# Patient Record
Sex: Female | Born: 2000 | Race: White | Hispanic: No | Marital: Single | Smoking: Former smoker
Health system: Southern US, Community
[De-identification: ages and names within clinical notes are randomized; demographics above are authoritative.]

## PROBLEM LIST (undated history)

## (undated) DIAGNOSIS — R569 Unspecified convulsions: Secondary | ICD-10-CM

## (undated) DIAGNOSIS — R519 Headache, unspecified: Secondary | ICD-10-CM

## (undated) DIAGNOSIS — Q040 Congenital malformations of corpus callosum: Secondary | ICD-10-CM

## (undated) HISTORY — PX: NO PAST SURGERIES: SHX2092

## (undated) HISTORY — PX: HX NO SURGICAL PROCEDURES: 2100001501

---
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
## 2019-01-05 ENCOUNTER — Encounter (HOSPITAL_COMMUNITY): Payer: Self-pay

## 2021-07-30 ENCOUNTER — Other Ambulatory Visit: Payer: Self-pay

## 2021-07-30 ENCOUNTER — Emergency Department
Admission: EM | Admit: 2021-07-30 | Discharge: 2021-07-30 | Disposition: A | Discharge status: Home / Self Care | Attending: Physician Assistant | Admitting: Physician Assistant

## 2021-07-30 ENCOUNTER — Encounter (HOSPITAL_COMMUNITY): Payer: Self-pay | Admitting: NURSE PRACTITIONER

## 2021-07-30 DIAGNOSIS — T7840XA Allergy, unspecified, initial encounter: Secondary | ICD-10-CM | POA: Insufficient documentation

## 2021-07-30 LAB — HCG, SERUM QUALITATIVE, PREGNANCY: PREGNANCY, SERUM QUALITATIVE: NEGATIVE

## 2021-07-30 LAB — LAVENDER TOP TUBE

## 2021-07-30 LAB — GRAY TOP TUBE

## 2021-07-30 LAB — BLUE TOP TUBE

## 2021-07-30 LAB — LIGHT GREEN TOP TUBE

## 2021-07-30 MED ORDER — METHYLPREDNISOLONE SOD SUCC 125 MG SOLUTION FOR INJECTION WRAPPER
125.0000 mg | INTRAVENOUS | Status: AC
Start: 2021-07-30 — End: 2021-07-30
  Administered 2021-07-30: 125 mg via INTRAVENOUS
  Filled 2021-07-30: qty 2

## 2021-07-30 MED ORDER — DIPHENHYDRAMINE 50 MG/ML INJECTION SOLUTION
12.5000 mg | INTRAMUSCULAR | Status: AC
Start: 2021-07-30 — End: 2021-07-30
  Administered 2021-07-30: 12.5 mg via INTRAVENOUS
  Filled 2021-07-30: qty 1

## 2021-07-30 NOTE — Nurses Notes (Signed)
Discharge reviewed with patient. Printed AVS given at this time as well as verbal instructions. Questions if any answered sufficiently. Pt voiced understanding of the instructions. Educated pt that if any worsening or concerning symptoms present they are encouraged to come back to be seen. Pt to POV.
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
## 2021-07-30 NOTE — Discharge Instructions (Addendum)
Drink plenty of water.   Take Benadryl every 4 hours as needed for allergy.    Try to avoid contact with honey.

## 2021-07-30 NOTE — ED Triage Notes (Signed)
Allergic to honey, ate food with honey accidentally now complains of "pins and needles" in throat and mouth.

## 2021-08-10 ENCOUNTER — Emergency Department (HOSPITAL_COMMUNITY): Payer: Medicaid - Out of State

## 2021-08-10 ENCOUNTER — Encounter (HOSPITAL_COMMUNITY): Payer: Self-pay

## 2021-08-10 ENCOUNTER — Other Ambulatory Visit: Payer: Self-pay

## 2021-08-10 ENCOUNTER — Emergency Department
Admission: EM | Admit: 2021-08-10 | Discharge: 2021-08-10 | Disposition: A | Discharge status: Home / Self Care | Payer: Medicaid - Out of State | Attending: PHYSICIAN ASSISTANT | Admitting: PHYSICIAN ASSISTANT

## 2021-08-10 DIAGNOSIS — S20212A Contusion of left front wall of thorax, initial encounter: Secondary | ICD-10-CM | POA: Insufficient documentation

## 2021-08-10 DIAGNOSIS — Z8669 Personal history of other diseases of the nervous system and sense organs: Secondary | ICD-10-CM | POA: Insufficient documentation

## 2021-08-10 DIAGNOSIS — F17290 Nicotine dependence, other tobacco product, uncomplicated: Secondary | ICD-10-CM | POA: Insufficient documentation

## 2021-08-10 DIAGNOSIS — N3000 Acute cystitis without hematuria: Secondary | ICD-10-CM | POA: Insufficient documentation

## 2021-08-10 DIAGNOSIS — R519 Headache, unspecified: Secondary | ICD-10-CM | POA: Insufficient documentation

## 2021-08-10 DIAGNOSIS — W500XXA Accidental hit or strike by another person, initial encounter: Secondary | ICD-10-CM | POA: Insufficient documentation

## 2021-08-10 DIAGNOSIS — Z20822 Contact with and (suspected) exposure to covid-19: Secondary | ICD-10-CM | POA: Insufficient documentation

## 2021-08-10 DIAGNOSIS — J209 Acute bronchitis, unspecified: Secondary | ICD-10-CM | POA: Insufficient documentation

## 2021-08-10 HISTORY — DX: Unspecified convulsions (CMS HCC): R56.9

## 2021-08-10 HISTORY — DX: Headache, unspecified: R51.9

## 2021-08-10 LAB — CBC WITH DIFF
BASOPHIL #: <0.1 10*3/uL (ref <=0.20)
BASOPHIL %: 1 %
EOSINOPHIL #: 0.2 10*3/uL (ref <=0.50)
EOSINOPHIL %: 2 %
HCT: 38.4 % (ref 34.8–46.0)
HGB: 13.7 g/dL (ref 11.5–16.0)
IMMATURE GRANULOCYTE #: <0.1 10*3/uL (ref <0.10)
IMMATURE GRANULOCYTE %: 0 % (ref 0–1)
LYMPHOCYTE #: 3.13 10*3/uL (ref 1.00–4.80)
LYMPHOCYTE %: 36 %
MCH: 31.8 pg (ref 26.0–32.0)
MCHC: 35.7 g/dL — ABNORMAL HIGH (ref 31.0–35.5)
MCV: 89.1 fL (ref 78.0–100.0)
MONOCYTE #: 0.48 10*3/uL (ref 0.20–1.10)
MONOCYTE %: 6 %
MPV: 9.7 fL (ref 8.7–12.5)
NEUTROPHIL #: 4.76 10*3/uL (ref 1.50–7.70)
NEUTROPHIL %: 55 %
PLATELETS: 330 10*3/uL (ref 150–400)
RBC: 4.31 10*6/uL (ref 3.85–5.22)
RDW-CV: 11.4 % — ABNORMAL LOW (ref 11.5–15.5)
WBC: 8.6 10*3/uL (ref 3.7–11.0)

## 2021-08-10 LAB — GRAY TOP TUBE

## 2021-08-10 LAB — COMPREHENSIVE METABOLIC PANEL, NON-FASTING
ALBUMIN: 4.4 g/dL (ref 3.5–5.0)
ALKALINE PHOSPHATASE: 74 U/L (ref 40–110)
ALT (SGPT): 23 U/L — ABNORMAL HIGH (ref 8–22)
ANION GAP: 11 mmol/L (ref 4–13)
AST (SGOT): 19 U/L (ref 8–45)
BILIRUBIN TOTAL: 0.5 mg/dL (ref 0.3–1.3)
BUN/CREA RATIO: 12 (ref 6–22)
BUN: 8 mg/dL (ref 8–25)
CALCIUM: 9.8 mg/dL (ref 8.5–10.0)
CHLORIDE: 107 mmol/L (ref 96–111)
CO2 TOTAL: 23 mmol/L (ref 22–30)
CREATININE: 0.65 mg/dL (ref 0.60–1.05)
ESTIMATED GFR: >90 mL/min/BSA (ref >=60)
GLUCOSE: 99 mg/dL (ref 65–125)
POTASSIUM: 3.9 mmol/L (ref 3.5–5.1)
PROTEIN TOTAL: 7.6 g/dL (ref 6.4–8.3)
SODIUM: 141 mmol/L (ref 136–145)

## 2021-08-10 LAB — URINALYSIS, MACROSCOPIC
BILIRUBIN: NEGATIVE mg/dL
BLOOD: NEGATIVE mg/dL
GLUCOSE: NEGATIVE mg/dL
KETONES: NEGATIVE mg/dL
NITRITE: NEGATIVE
PH: 6.5 (ref 5.0–8.5)
PROTEIN: NEGATIVE mg/dL
SPECIFIC GRAVITY: 1.015 (ref 1.005–1.030)
UROBILINOGEN: 0.2 mg/dL

## 2021-08-10 LAB — URINALYSIS, MICROSCOPIC

## 2021-08-10 LAB — COVID-19 ~~LOC~~ MOLECULAR LAB TESTING: SARS-CoV-2: NOT DETECTED

## 2021-08-10 LAB — BLUE TOP TUBE

## 2021-08-10 LAB — C-REACTIVE PROTEIN(CRP),INFLAMMATION: CRP INFLAMMATION: <0.4 mg/L (ref <8.0)

## 2021-08-10 LAB — HCG, SERUM QUALITATIVE, PREGNANCY: PREGNANCY, SERUM QUALITATIVE: NEGATIVE

## 2021-08-10 MED ORDER — CEFDINIR 300 MG CAPSULE
300.0000 mg | ORAL_CAPSULE | Freq: Two times a day (BID) | ORAL | 0 refills | Status: AC
Start: 2021-08-10 — End: 2021-08-20

## 2021-08-10 MED ORDER — CEFDINIR 300 MG CAPSULE
300.0000 mg | ORAL_CAPSULE | Freq: Two times a day (BID) | ORAL | Status: DC
Start: 2021-08-10 — End: 2021-08-10

## 2021-08-10 NOTE — Discharge Instructions (Addendum)
You were seen today for a urinary tract ifection and bronchitits. I am placing you on antibiotics.  Please follow-up with a family doctor for further evaluation of your headaches.  This does warrant a further workup in which cannot be completed to the ER.  However there were no acute findings on your x-rays or head CT today.  Please return to the ER for any fever, worsening symptoms, or new symptoms of concern.

## 2021-08-10 NOTE — ED Provider Notes (Signed)
[facility]  Emergency Department  Provider Note      Glenda Paul  07/05/2001  20 y.o.  female  [place] [address]   [phone] (home)  No Pcp    Chief Complaint:   Chief Complaint   Patient presents with   . Cough   . Vomiting       Arrival: POV    HPI: This is a 20 y.o. female with PMH of headaches and seizures who presents to the emergency department with complaints of coughing until she vomits x2 days.  She has had a headache.  Patient states she has been having migraines but these are the worst migraine she has ever had.   No fever.  Patient states that she was wrestling with her boyfriend when he fell on her and she is now complaining of left rib pain.  This happened today.    Patient is complaining of low back pain over the past 2 weeks.  She denies any injury.  She is concerned that she has a urinary tract infection because her mother told her it runs in her family.  She denies dysuria and hematuria.    Patient has had no known COVID exposures.    Past Medical History:   Past Medical History:   Diagnosis Date   . Convulsions (CMS HCC)    . Headache        Past Surgical History:   Past Surgical History:   Procedure Laterality Date   . Hx no surgical procedures         Social History:   Social History     Tobacco Use   . Smoking status: Former     Types: Cigarettes   . Smokeless tobacco: Never   Vaping Use   . Vaping Use: Every day   Substance Use Topics   . Alcohol use: Yes   . Drug use: Not Currently      Social History     Substance and Sexual Activity   Drug Use Not Currently       Allergies:   Allergies   Allergen Reactions   . Honey    . Pineapple        Medications:   Prior to Admission medications    Not on File       Above history, allergies and medication list reviewed with patient.      Review of Systems:  Review of systems as below.  Additional systems reviewed in HPI.   Consitutional:  Negative for fever, chills and fatigue/malaise.  Skin:  Negative for rashes or  other concerning lesions.  HENT:  Positive for headaches.  Negative for  sinus drainage, congestion and sorethoat.  Eyes:  Negative for blurry vision, double vision or acute vision loss.  Cardiovascular:  Negative for chest pain, orthopnea, DOE and palpitations. Negative for peripheral edema.  Respiratory:  Positive for cough, shortness of breath and negative for wheezing.  Gastrointestinal:  No abominal pain. Negative for nausea and vomiting. Negative for diarrhea, constipation, melena or hematochezia.  Musculoskeletal:  Positive for back pain.  Negative for neck pain.   Neurological:  Negative for dizziness, loss of consciousness, numbness and tingling. No acute localized weakness. No speech changes.  Psych:  Negative for anxiety, depression and hallucinations.  No suicidal ideations or attempts.  No homicidal ideations or attempts.  Genitourinary:  Negative for dysuria, hematuria and flank pain. No discharge .  LMP recorded (lmp unknown).  All other systems reviewed and negative  unless documented otherwise.      Physical Exam  RN note reviewed. Vitals reviewed.   Body mass index is 30.11 kg/m.  ED Triage Vitals [08/10/21 1755]   BP (Non-Invasive) 116/72   Heart Rate 88   Respiratory Rate 14   Temperature 36.7 C (98 F)   SpO2 99 %   Weight 77.1 kg (170 lb)   Height 1.6 m (5\' 3" )     Constitutional:  Pleasant female sitting up on the exam table of ED 3.  patient is alert. Well-developed, well-nourished, and in no acute distress. Nontoxic on appearance.   HENT:   Mask in place.  Head: Normocephalic and atraumatic.  No temporal artery tenderness.  Right Ear: External ear normal.   Left Ear: External ear normal.   Nose: Nose normal.   Mouth/Throat: Mucosal membranes are moist. Oropharynx is clear. Uvula is midline. Airway is patent.  Eyes: Pupils are equal, round, and reactive to light. Conjunctivae clear and EOM are normal.   Neck: Normal range of motion. Neck supple.  Trachea midline.  Meningeal signs are  negative.  Cardiovascular: Normal rate, regular rhythm, normal heart sounds and intact distal pulses.   Pulmonary/Chest:  Breath sounds normal.  Effort normal. No chest wall deformity.  Left anterior rib tenderness on palpation.  Abdominal: Non-tender. Soft. No ascites. Bowel sounds are normal.  No rebound tenderness or guarding.  No cerebrovascular accident tenderness.  Musculoskeletal: Normal range of motion for patient's baseline. No acute deformities.  Neurological: Patient is alert and oriented to person, place, and time. CN II-XII are intact. GCS score is 15. Sensory is at patient's baseline. Gait is at patient's baseline.  Skin: Skin is warm and dry. No rashes  Psychiatric: Mood, affect and judgment normal.  Memory is at patient's baseline.      ED Course/ MDM/ Plan:   Patient was triaged, vital signs were obtained, patient was  placed in a room.  On exam, patient is alert and oriented, nontoxic on exam, and in no acute distress. Vitals were reviewed. The following orders were placed after examining the patient and the following results were reviewed by me.    Orders Placed This Encounter   . URINE CULTURE,ROUTINE   . XR RIBS LEFT W PA/AP CHEST   . CT BRAIN WO IV CONTRAST   . CBC/DIFF   . COMPREHENSIVE METABOLIC PANEL, NON-FASTING   . C-REACTIVE PROTEIN(CRP),INFLAMMATION   . URINALYSIS, MACROSCOPIC AND MICROSCOPIC   . HCG, SERUM QUALITATIVE, PREGNANCY   . COVID-19 Screening - COVID Only   . CBC WITH DIFF   . URINALYSIS, MACROSCOPIC   . URINALYSIS, MICROSCOPIC   . EXTRA TUBES   . BLUE TOP TUBE   . GRAY TOP TUBE   . cefdinir (OMNICEF) capsule   . cefdinir (OMNICEF) 300 mg Oral Capsule     XR RIBS LEFT W PA/AP CHEST   Final Result   No consolidation to suggest pneumonia. No rib fracture identified.                        Radiologist location ID:         CT BRAIN WO IV CONTRAST   Final Result   No acute intracranial abnormality.         Radiologist location ID: [id number]           Labs Reviewed    COMPREHENSIVE METABOLIC PANEL, NON-FASTING - Abnormal; Notable for the following components:  Result Value    ALT (SGPT) 23 (*)     All other components within normal limits    Narrative:     Hemolysis can alter results at this level (slight).  Hemolysis can alter results at this level (slight).   CBC WITH DIFF - Abnormal; Notable for the following components:    MCHC 35.7 (*)     RDW-CV 11.4 (*)     All other components within normal limits   URINALYSIS, MACROSCOPIC - Abnormal; Notable for the following components:    LEUKOCYTES Small (*)     All other components within normal limits   URINALYSIS, MICROSCOPIC - Abnormal; Notable for the following components:    WBCS 6-10 (*)     BACTERIA Occasional (*)     CLUE CELLS Present (*)     MUCOUS Moderate (*)     All other components within normal limits   C-REACTIVE PROTEIN(CRP),INFLAMMATION - Normal   HCG, SERUM QUALITATIVE, PREGNANCY - Normal   COVID-19 [facility] TESTING - Normal    Narrative:     Results are for the qualitative identification of SARS-CoV-2 (formerly 2019-nCoV) RNA. The SARS-CoV-2 RNA is generally detectable in nasopharyngeal swabs and nasal wash/aspirates during the ACUTE PHASE of infection. Hence, this test is intended to be performed on respiratory specimens collected from individuals who meet Centers for Disease Control and Prevention (CDC) clinical and/or epidemiological criteria for Coronavirus Disease 2019 (COVID-19) testing. CDC COVID-19 criteria for testing on human specimens are available at [facility] webpage information for Healthcare Professionals: Coronavirus Disease 2019 (COVID-19) [email or web address]).    Disclaimer:  This assay has been authorized by FDA under an Emergency Use Authorization for use in laboratories certified under the Clinical Laboratory Improvement Amendments of 1988 (CLIA), 42 U.S.C. [phone], to perform high complexity tests. The impacts of vaccines, antiviral  therapeutics, antibiotics, chemotherapeutic or immunosuppressant drugs have not been evaluated.    Test methodology:   Cepheid Xpert Xpress SARS-CoV-2 Assay real-time polymerase chain reaction (RT-PCR) test on the GeneXpert Dx system.   URINE CULTURE,ROUTINE   CBC/DIFF    Narrative:     The following orders were created for panel order CBC/DIFF.  Procedure                               Abnormality         Status                     ---------                               -----------         ------                     CBC WITH MVEH[[id number]]                Abnormal            Final result                 Please view results for these tests on the individual orders.   URINALYSIS, MACROSCOPIC AND MICROSCOPIC    Narrative:     The following orders were created for panel order URINALYSIS, MACROSCOPIC AND MICROSCOPIC.  Procedure  Abnormality         Status                     ---------                               -----------         ------                     URINALYSIS, MACROSCOPIC[[id number]]      Abnormal            Final result               URINALYSIS, MICROSCOPIC[[id number]]      Abnormal            Final result                 Please view results for these tests on the individual orders.   EXTRA TUBES    Narrative:     The following orders were created for panel order EXTRA TUBES.  Procedure                               Abnormality         Status                     ---------                               -----------         ------                     Trixie Rude DXIP[[id number]]                                    In process                 GRAY TOP QBHA[[id number]]                                    In process                   Please view results for these tests on the individual orders.   BLUE TOP TUBE   GRAY TOP TUBE     Results for orders placed or performed during the hospital encounter of 08/10/21 (from the past 12 hour(s))   COMPREHENSIVE METABOLIC PANEL, NON-FASTING   Result Value  Ref Range    SODIUM 141 136 - 145 mmol/L    POTASSIUM 3.9 3.5 - 5.1 mmol/L    CHLORIDE 107 96 - 111 mmol/L    CO2 TOTAL 23 22 - 30 mmol/L    ANION GAP 11 4 - 13 mmol/L    BUN 8 8 - 25 mg/dL    CREATININE 9.73 5.32 - 1.05 mg/dL    BUN/CREA RATIO 12 6 - 22    ESTIMATED GFR >90 >=60 mL/min/BSA    ALBUMIN 4.4 3.5 - 5.0 g/dL     CALCIUM 9.8 8.5 - 99.2 mg/dL    GLUCOSE 99 65 - 426 mg/dL    ALKALINE  PHOSPHATASE 74 40 - 110 U/L    ALT (SGPT) 23 (H) 8 - 22 U/L    AST (SGOT)  19 8 - 45 U/L    BILIRUBIN TOTAL 0.5 0.3 - 1.3 mg/dL    PROTEIN TOTAL 7.6 6.4 - 8.3 g/dL   C-REACTIVE PROTEIN(CRP),INFLAMMATION   Result Value Ref Range    CRP INFLAMMATION <0.4 <8.0 mg/L   HCG, SERUM QUALITATIVE, PREGNANCY   Result Value Ref Range    PREGNANCY, SERUM QUALITATIVE Negative Negative   CBC WITH DIFF   Result Value Ref Range    WBC 8.6 3.7 - 11.0 x10^3/uL    RBC 4.31 3.85 - 5.22 x10^6/uL    HGB 13.7 11.5 - 16.0 g/dL    HCT 15.1 83.4 - 37.3 %    MCV 89.1 78.0 - 100.0 fL    MCH 31.8 26.0 - 32.0 pg    MCHC 35.7 (H) 31.0 - 35.5 g/dL    RDW-CV 57.8 (L) 97.8 - 15.5 %    PLATELETS 330 150 - 400 x10^3/uL    MPV 9.7 8.7 - 12.5 fL    NEUTROPHIL % 55 %    LYMPHOCYTE % 36 %    MONOCYTE % 6 %    EOSINOPHIL % 2 %    BASOPHIL % 1 %    NEUTROPHIL # 4.76 1.50 - 7.70 x10^3/uL    LYMPHOCYTE # 3.13 1.00 - 4.80 x10^3/uL    MONOCYTE # 0.48 0.20 - 1.10 x10^3/uL    EOSINOPHIL # 0.20 <=0.50 x10^3/uL    BASOPHIL # <0.10 <=0.20 x10^3/uL    IMMATURE GRANULOCYTE % 0 0 - 1 %    IMMATURE GRANULOCYTE # <0.10 <0.10 x10^3/uL   COVID-19 Screening - COVID Only   Result Value Ref Range    SARS-CoV-2 Not Detected Not Detected   URINALYSIS, MACROSCOPIC   Result Value Ref Range    COLOR Yellow Yellow, Straw    APPEARANCE Clear Clear, Slightly Hazy    SPECIFIC GRAVITY 1.015 1.005 - 1.030    PH 6.5 5.0 - 8.5    LEUKOCYTES Small (A) Negative WBCs/uL    NITRITE Negative Negative    PROTEIN Negative Negative mg/dL    GLUCOSE Negative Negative mg/dL    KETONES Negative Negative mg/dL     UROBILINOGEN 0.2 0.2 mg/dL    BILIRUBIN Negative Negative mg/dL    BLOOD Negative Negative mg/dL   URINALYSIS, MICROSCOPIC   Result Value Ref Range    RBCS 0-2 None, Occasional, 0-2, 3-5 /hpf    WBCS 6-10 (A) None, Occasional, 0-2, 3-5 /hpf    BACTERIA Occasional (A) None /hpf    SQUAMOUS EPITHELIAL 3-5 None, Occasional, 0-2, 3-5 /hpf    CLUE CELLS Present (A) Absent    MUCOUS Moderate (A) None /hpf     XR RIBS LEFT W PA/AP CHEST   Final Result by Edi, Radresults In (10/16 2010)   No consolidation to suggest pneumonia. No rib fracture identified.                        Radiologist location ID: [id number]         CT BRAIN WO IV CONTRAST   Final Result by Edi, Radresults In (10/16 2003)   No acute intracranial abnormality.         Radiologist location ID: [id number]               The patient received the following medications while in the ED (also  noted above):  Medications Administered in the ED   cefdinir (OMNICEF) capsule (has no administration in time range)       ED Course as of 08/10/21 2051   [facility] Aug 10, 2021   1907 CBC/DIFF(!)  Unremarkable   1941 C-REACTIVE PROTEIN HIGH SENSITIVITY (INFLAMMATION): <0.4  Undetectable   1941 PREGNANCY, SERUM QUALITATIVE: Negative  Negative   1941 COMPREHENSIVE METABOLIC PANEL, NON-FASTING(!)  ALT elevated at 23.   2035 SARS CORONAVIRUS 2 (SARS-CoV-2): Not Detected  Negative    2037 URINALYSIS, MACROSCOPIC AND MICROSCOPIC(!)  Small leukocyte estrace    2045 URINALYSIS, MACROSCOPIC AND MICROSCOPIC(!)  Urine culture obtained         Procedures  None    Critical Care:    Not applicable.    Work-up completed.  No rib fractures.  No pneumonia.  Patient's vitals were stable without hypoxia.  Urine did reveal a urinary tract infection.  Will place patient on cefdinir for urinary tract infection.  Urine culture was obtained.  Advised patient to establish care with a PCP for further workup of her headaches.  Hemodynamically stable neurovascularly intact for discharge home.  Patient  agrees with treatment plan.    Clinical Impression:   Encounter Diagnoses   Name Primary?   . Acute cystitis without hematuria Yes   . Acute bronchitis, unspecified organism    . Contusion of rib on left side, initial encounter    . Nonintractable headache, unspecified chronicity pattern, unspecified headache type        Following the above history, physical exam and studies, the patient was deemed stable and suitable for discharge.  Discharge and medication instructions were discussed with the patient and all questions were addressed. The patient understands that they may return to the ED at any time for new or worsening symptoms including Hypoxia, hematuria, fever, or if they have any other concerns    Disposition: Discharged  New Prescriptions    CEFDINIR (OMNICEF) 300 MG ORAL CAPSULE    Take 1 Capsule (300 mg total) by mouth Twice daily for 10 days      Primary Care Provider  Please establish care with a PCP for further follow-up.  In 3 days      [facility] [place]  [address]  [place] [address]  [phone]    As needed, If symptoms worsen     No future appointments.  BP 116/72   Pulse 88   Temp 36.7 C (98 F)   Resp 14   Ht 1.6 m (5\' 3" )   Wt 77.1 kg (170 lb)   LMP  (LMP Unknown)   SpO2 99%   BMI 30.11 kg/m          , PA-C10/16/2022       This note was partially created using voice recognition software and is inherently subject to errors including those of syntax and "sound alike " substitutions which may escape proof reading.  In such instances, original meaning may be extrapolated by contextual derivation.

## 2021-08-10 NOTE — ED Triage Notes (Signed)
Cough, vomiting, back pain, headache x 2 days  "My migraines have been getting worse x 2 weeks."

## 2021-08-10 NOTE — ED Nurses Note (Signed)
Pt in room, A&O, stable. Informed on needed urine to collect. Placed in gown for radiology, denies any needs, call bell in reach

## 2021-08-10 NOTE — ED Nurses Note (Signed)
Patient discharged with printed AVS and all personal belongings. Pt accompanied by female guest who leaves ED with patient.  Pt ambulates without assistance, gait steady and even, VSS; MAE: AOx4

## 2021-08-12 LAB — URINE CULTURE,ROUTINE: URINE CULTURE: >100000 — AB

## 2022-12-28 DIAGNOSIS — Z87891 Personal history of nicotine dependence: Secondary | ICD-10-CM | POA: Insufficient documentation

## 2022-12-28 DIAGNOSIS — Z3200 Encounter for pregnancy test, result unknown: Secondary | ICD-10-CM | POA: Insufficient documentation

## 2022-12-28 DIAGNOSIS — N390 Urinary tract infection, site not specified: Secondary | ICD-10-CM | POA: Insufficient documentation

## 2022-12-28 DIAGNOSIS — Z87440 Personal history of urinary (tract) infections: Secondary | ICD-10-CM | POA: Insufficient documentation

## 2022-12-28 DIAGNOSIS — Z3A01 Less than 8 weeks gestation of pregnancy: Secondary | ICD-10-CM

## 2022-12-28 DIAGNOSIS — O26891 Other specified pregnancy related conditions, first trimester: Secondary | ICD-10-CM | POA: Insufficient documentation

## 2022-12-28 DIAGNOSIS — O200 Threatened abortion: Secondary | ICD-10-CM | POA: Insufficient documentation

## 2022-12-28 LAB — BASIC METABOLIC PANEL
ANION GAP: 5 mmol/L (ref 4–13)
BUN/CREA RATIO: 13 (ref 6–22)
BUN: 9 mg/dL (ref 8–25)
CALCIUM: 8.8 mg/dL (ref 8.6–10.2)
CHLORIDE: 110 mmol/L (ref 96–111)
CO2 TOTAL: 23 mmol/L (ref 22–30)
CREATININE: 0.7 mg/dL (ref 0.60–1.05)
ESTIMATED GFR - FEMALE: >90 mL/min/BSA (ref >=60)
GLUCOSE: 91 mg/dL (ref 65–125)
POTASSIUM: 3.5 mmol/L (ref 3.5–5.1)
SODIUM: 138 mmol/L (ref 136–145)

## 2022-12-28 LAB — URINALYSIS, MACRO/MICRO
BILIRUBIN: NEGATIVE mg/dL
BLOOD: 0.2 mg/dL — AB
COLOR: NORMAL
GLUCOSE: NEGATIVE mg/dL
KETONES: NEGATIVE mg/dL
LEUKOCYTES: 75 WBCs/uL
NITRITE: NEGATIVE
PH: 6.5 (ref 5.0–8.0)
PROTEIN: 10 mg/dL
SPECIFIC GRAVITY: >1.03 — ABNORMAL HIGH (ref 1.005–1.030)
UROBILINOGEN: NEGATIVE mg/dL

## 2022-12-28 LAB — CBC WITH DIFF
BASOPHIL #: <0.1 10*3/uL (ref <=0.20)
BASOPHIL %: 1 %
EOSINOPHIL #: 0.21 10*3/uL (ref <=0.50)
EOSINOPHIL %: 3 %
HCT: 38 % (ref 34.8–46.0)
HGB: 12.8 g/dL (ref 11.5–16.0)
IMMATURE GRANULOCYTE #: <0.1 10*3/uL (ref <0.10)
IMMATURE GRANULOCYTE %: 0 % (ref 0.0–1.0)
LYMPHOCYTE #: 2.89 10*3/uL (ref 1.00–4.80)
LYMPHOCYTE %: 44 %
MCH: 31.4 pg (ref 26.0–32.0)
MCHC: 33.7 g/dL (ref 31.0–35.5)
MCV: 93.1 fL (ref 78.0–100.0)
MONOCYTE #: 0.35 10*3/uL (ref 0.20–1.10)
MONOCYTE %: 5 %
MPV: 9.3 fL (ref 8.7–12.5)
NEUTROPHIL #: 2.98 10*3/uL (ref 1.50–7.70)
NEUTROPHIL %: 47 %
PLATELETS: 341 10*3/uL (ref 150–400)
RBC: 4.08 10*6/uL (ref 3.85–5.22)
RDW-CV: 11.3 % — ABNORMAL LOW (ref 11.5–15.5)
WBC: 6.5 10*3/uL (ref 3.7–11.0)

## 2022-12-28 LAB — TYPE AND SCREEN
ABO/RH(D): O POS
ANTIBODY SCREEN: NEGATIVE

## 2022-12-28 LAB — HCG, PLASMA OR SERUM QUANTITATIVE, PREGNANCY: HCG QUANTITATIVE PREGNANCY: 24 m[IU]/mL — ABNORMAL HIGH (ref <5)

## 2022-12-28 NOTE — ED Nurses Note (Signed)
The risk and benefits have been discussed with the patient in regards to placing a peripheral intravenous catheter (PIV) and/or drawing labs in the triage area. The patient was advised that they may have to wait in the ED waiting room after the PIV is placed. The patient was advised not to tamper with the PIV or infuse anything through the PIV. The patient was advised if there is any concern or problem with the PIV to contact a nurse or a staff representative to contact a nurse for the patient. The patient was advised if they choose to leave before being seen by a provider or without treatment; the patient needs to notify the a nurse or a staff representative to contact a nurse, so the PIV can be removed. The patient is aware not to leave the ED waiting room area while the PIV is in place. The patient verbalizes understanding and agrees with the plan of action.

## 2022-12-28 NOTE — ED Triage Notes (Signed)
Pt states that she was told she was 7-10 days pregnant at another facility. Pt states that she is now having cramping and bleeding.

## 2022-12-29 ENCOUNTER — Emergency Department
Admission: EM | Admit: 2022-12-29 | Discharge: 2022-12-29 | Disposition: A | Discharge status: Home / Self Care | Payer: Medicaid - Out of State | Attending: PHYSICIAN ASSISTANT | Admitting: PHYSICIAN ASSISTANT

## 2022-12-29 ENCOUNTER — Other Ambulatory Visit: Payer: Self-pay

## 2022-12-29 ENCOUNTER — Emergency Department (HOSPITAL_COMMUNITY): Payer: Medicaid - Out of State | Admitting: Radiology

## 2022-12-29 ENCOUNTER — Emergency Department (HOSPITAL_COMMUNITY): Payer: BC Managed Care – PPO

## 2022-12-29 DIAGNOSIS — O26891 Other specified pregnancy related conditions, first trimester: Secondary | ICD-10-CM | POA: Insufficient documentation

## 2022-12-29 DIAGNOSIS — Z87891 Personal history of nicotine dependence: Secondary | ICD-10-CM | POA: Insufficient documentation

## 2022-12-29 DIAGNOSIS — Z87440 Personal history of urinary (tract) infections: Secondary | ICD-10-CM | POA: Insufficient documentation

## 2022-12-29 DIAGNOSIS — O200 Threatened abortion: Secondary | ICD-10-CM | POA: Insufficient documentation

## 2022-12-29 DIAGNOSIS — N390 Urinary tract infection, site not specified: Secondary | ICD-10-CM | POA: Insufficient documentation

## 2022-12-29 DIAGNOSIS — Z3200 Encounter for pregnancy test, result unknown: Secondary | ICD-10-CM | POA: Insufficient documentation

## 2022-12-29 MED ORDER — CEPHALEXIN 500 MG CAPSULE
500.0000 mg | ORAL_CAPSULE | Freq: Four times a day (QID) | ORAL | 0 refills | Status: AC
Start: 2022-12-29 — End: 2023-01-08

## 2022-12-29 MED ORDER — CEPHALEXIN 500 MG CAPSULE
500.0000 mg | ORAL_CAPSULE | ORAL | Status: AC
Start: 2022-12-29 — End: 2022-12-29
  Administered 2022-12-29: 500 mg via ORAL
  Filled 2022-12-29: qty 1

## 2022-12-29 MED ORDER — PSEUDOEPHEDRINE 30 MG TABLET
30.0000 mg | ORAL_TABLET | ORAL | Status: DC
Start: 2022-12-29 — End: 2022-12-29

## 2022-12-29 NOTE — ED Provider Notes (Signed)
[facility] Emergency Department      Chief Complaint:  Patient presents with     Chief Complaint   Patient presents with    Pregnancy Problem         HPI    Glenda Paul, date of birth January 30, 2001, is a 22 y.o. female who presents to the Emergency Department via POV with past medical history gravida 3 para 1 AB1 approximately 7-10 days pregnant reporting increased abdominal pain, pelvic pain and vaginal spotting.  Patient states she was recently seen at another facility for similar symptoms and was instructed to return immediately if symptoms return.  No recent fever, chills or flu-like symptoms.  No chest pain or shortness of breath.  No UTI symptoms or bowel changes but states she was treated for urinary tract infection but can not remember antibiotic and states she has not gotten prescription filled    Patient has past medical history of convulsions and headache    Past Medical History:  Diagnosis     Past Medical History:   Diagnosis Date    Convulsions ([facility] [place])     Headache        Past Surgical History:  Past Surgical History:   Procedure Laterality Date    Hx no surgical procedures         Family History: No family history on file.    Social History     Social History     Tobacco Use    Smoking status: Former     Types: Cigarettes    Smokeless tobacco: Never   Vaping Use    Vaping status: Every Day   Substance Use Topics    Alcohol use: Yes    Drug use: Not Currently       Social History Main Topics     Social History     Substance and Sexual Activity   Drug Use Not Currently       Allergies   Allergen Reactions    Honey     Pineapple          Review of Systems   Constitutional:  Negative for chills and fever.   HENT:  Negative for dental problem and sore throat.    Eyes:  Negative for redness and visual disturbance.   Respiratory:  Negative for cough and shortness of breath.    Cardiovascular:  Negative for chest pain and leg swelling.   Gastrointestinal:  Negative for abdominal pain, nausea  and vomiting.   Endocrine: Negative for polydipsia and polyuria.   Genitourinary:  Positive for pelvic pain and vaginal bleeding. Negative for decreased urine volume, difficulty urinating, dyspareunia, dysuria, enuresis, flank pain, frequency, genital sores, hematuria, menstrual problem, urgency, vaginal discharge and vaginal pain.   Musculoskeletal:  Negative for back pain and neck pain.   Skin:  Negative for color change and rash.   Neurological:  Negative for dizziness and weakness.   Psychiatric/Behavioral:  Negative for suicidal ideas. The patient is not nervous/anxious.        Vitals:  Filed Vitals:    12/28/22 1952 12/29/22 0045 12/29/22 0115   BP: 111/64 109/68 105/68   Pulse: 74 65    Resp: 18 16    Temp: 36.9 C (98.5 F)     SpO2: 100% 99% 98%       Physical Exam  Vitals and nursing note reviewed.   Constitutional:       General: She is not in acute distress.  Appearance: She is well-developed.   HENT:      Head: Normocephalic and atraumatic.   Eyes:      Conjunctiva/sclera: Conjunctivae normal.   Cardiovascular:      Rate and Rhythm: Normal rate and regular rhythm.      Heart sounds: Normal heart sounds.   Pulmonary:      Effort: Pulmonary effort is normal. No respiratory distress.   Abdominal:      General: There is no distension.      Palpations: Abdomen is soft.      Tenderness: There is no guarding.   Genitourinary:     Comments: Patient declined pelvic exam  Musculoskeletal:         General: No deformity. Normal range of motion.      Cervical back: Normal range of motion and neck supple.   Skin:     General: Skin is warm and dry.   Neurological:      Mental Status: She is alert and oriented to person, place, and time.      Motor: No abnormal muscle tone.           Old records reviewed by me:         Diagnostics:    Labs:    Results for orders placed or performed during the hospital encounter of 12/29/22   URINE CULTURE,ROUTINE    Specimen: Urine, Site not specified   Result Value Ref Range     URINE CULTURE No significant growth 48 hours.    BASIC METABOLIC PANEL   Result Value Ref Range    SODIUM 138 136 - 145 mmol/L    POTASSIUM 3.5 3.5 - 5.1 mmol/L    CHLORIDE 110 96 - 111 mmol/L    CO2 TOTAL 23 22 - 30 mmol/L    ANION GAP 5 4 - 13 mmol/L    CALCIUM 8.8 8.6 - 10.2 mg/dL    GLUCOSE 91 65 - 125 mg/dL    BUN 9 8 - 25 mg/dL    CREATININE 0.70 0.60 - 1.05 mg/dL    BUN/CREA RATIO 13 6 - 22    ESTIMATED GFR - FEMALE >90 >=60 mL/min/BSA   HCG, PLASMA OR SERUM QUANTITATIVE, PREGNANCY   Result Value Ref Range    HCG QUANTITATIVE PREGNANCY 24 (H) <5 mIU/mL   CBC WITH DIFF   Result Value Ref Range    WBC 6.5 3.7 - 11.0 x10^3/uL    RBC 4.08 3.85 - 5.22 x10^6/uL    HGB 12.8 11.5 - 16.0 g/dL    HCT 38.0 34.8 - 46.0 %    MCV 93.1 78.0 - 100.0 fL    MCH 31.4 26.0 - 32.0 pg    MCHC 33.7 31.0 - 35.5 g/dL    RDW-CV 11.3 (L) 11.5 - 15.5 %    PLATELETS 341 150 - 400 x10^3/uL    MPV 9.3 8.7 - 12.5 fL    NEUTROPHIL % 47.0 %    LYMPHOCYTE % 44.0 %    MONOCYTE % 5.0 %    EOSINOPHIL % 3.0 %    BASOPHIL % 1.0 %    NEUTROPHIL # 2.98 1.50 - 7.70 x10^3/uL    LYMPHOCYTE # 2.89 1.00 - 4.80 x10^3/uL    MONOCYTE # 0.35 0.20 - 1.10 x10^3/uL    EOSINOPHIL # 0.21 <=0.50 x10^3/uL    BASOPHIL # <0.10 <=0.20 x10^3/uL    IMMATURE GRANULOCYTE % 0.0 0.0 - 1.0 %    IMMATURE GRANULOCYTE # <0.10 <0.10 x10^3/uL  URINALYSIS, MACRO/MICRO   Result Value Ref Range    COLOR Normal (Yellow) Normal (Yellow)    APPEARANCE Slightly Cloudy Clear, Slightly Cloudy    SPECIFIC GRAVITY >1.030 (H) 1.005 - 1.030    PH 6.5 5.0 - 8.0    LEUKOCYTES 75 WBCs/uL    NITRITE Negative 1+, Negative, 2+    PROTEIN 10 Negative, 10  mg/dL    GLUCOSE Negative Negative, 30  mg/dL    KETONES Negative mg/dL    UROBILINOGEN Negative Negative mg/dL    BILIRUBIN Negative Negative, 0.5 mg/dL    BLOOD 0.2 (A) Negative mg/dL    RBCS 75-100 (A) 0-2, None /hpf    WBCS 10-20 (A) 0-2, None /hpf    BACTERIA Few (A) None, Rare, Occasional, Occasional or less /hpf    SQUAMOUS EPITHELIAL Many  /hpf    MUCOUS Few /hpf   TYPE AND SCREEN   Result Value Ref Range    UNITS ORDERED NOT STATED     ABO/RH(D) O POSITIVE     ANTIBODY SCREEN NEGATIVE     SPECIMEN EXPIRATION DATE 12/31/2022,2359      Labs reviewed and interpreted by me.    Radiology:  Results for orders placed or performed during the hospital encounter of 12/29/22   US TRANSVAGINAL OB  [id number]     Status: None    Narrative    Barb Diefenderfer    PROCEDURE DESCRIPTION: US TRANSVAGINAL OB  [id number]    CLINICAL INDICATION: abdominal pain/vaginal bleeding/r/o ectopic    COMPARISON: None.    FINDINGS: Uterus is normal in size. Endometrial thickness of 8 mm. No intrauterine gestational sac identified.    The right ovary measures 3.6 x 1.8 x 3 cm, left ovary measures 3.3 x 1.6 x 2.8 cm. Blood flow demonstrated in both ovaries. Both ovaries contain small normal-appearing follicles. No cystic structure to suggest a gestational sac seen around either ovary.      Impression    Unremarkable ultrasound appearance of the uterus and ovaries. No intrauterine gestational sac or ultrasound evidence to suggest an ectopic pregnancy. Correlate with serum beta hCG.              Radiologist location ID: [id number]         EKG:      Procedures:     Orders:  Orders Placed This Encounter    URINE CULTURE,ROUTINE    CANCELED: US FETAL TRANSABDOMINAL 1ST TRI< 14 WKS  [id number]    US TRANSVAGINAL OB  [id number]    CANCELED: CT BRAIN WO IV CONTRAST    CBC/DIFF    BASIC METABOLIC PANEL    HCG, PLASMA OR SERUM QUANTITATIVE, PREGNANCY    URINALYSIS WITH REFLEX MICROSCOPIC AND CULTURE IF POSITIVE    CBC WITH DIFF    URINALYSIS, MACRO/MICRO    TYPE AND SCREEN    cephalexin (KEFLEX) capsule    cephalexin (KEFLEX) 500 mg Oral Capsule         ED Course/Medical Decision Making:  Glenda Paul, date of birth [birth year], is a 22 y.o. female who presents to the Emergency Department via POV with past medical history gravida 3 para 1 AB1 approximately 7-10 days pregnant reporting increased abdominal pain,  pelvic pain and vaginal spotting.  Patient states she was recently seen at another facility for similar symptoms and was instructed to return immediately if symptoms return at nearest emergency facility he has some starting.  No recent fever, chills or flu-like symptoms.  No chest pain or shortness  of breath.  No UTI symptoms or bowel changes but states she was treated for urinary tract infection but can not remember antibiotic and states she has not gotten prescription filled    Patient has past medical history of convulsions and headache    Differential normal menstrual cycle, blighted ovum, ectopic pregnancy, implantation or bleed, UTI    Patient remained stable during emergency department evaluation.  Patient declined pelvic exam stating she just had pelvic exam at another facility and did not feel it was indicated at this time.  After pelvic ultrasound patient became very upset and wanted to be discharged home with no further workup at this time.  Patient was instructed to return if symptoms worsen or problems occur.  Patient was given opportunity to ask questions with no further questions at time discharge.  Patient will be placed on cephalexin pending urine culture    Consults:  None      Disposition: Discharged         Clinical Impression   UTI (urinary tract infection) (Primary)   Threatened miscarriage in early pregnancy       Discharge Medication List as of 12/29/2022  1:46 AM        START taking these medications    Details   cephalexin (KEFLEX) 500 mg Oral Capsule Take 1 Capsule (500 mg total) by mouth Four times a day for 10 days, Disp-40 Capsule, R-0, Print                 The co-signing faculty was physically present in the emergency department and available for consultation and did not particpate in the care of this patient.    Shon Hough, PA-C  12/29/2022, 01:34

## 2022-12-29 NOTE — ED Nurses Note (Signed)
Patient discharged home with family.  AVS reviewed with patient/care giver.  A written copy of the AVS and discharge instructions was given to the patient/care giver.  Questions sufficiently answered as needed.  Patient/care giver encouraged to follow up with PCP as indicated.  In the event of an emergency, patient/care giver instructed to call 911 or go to the nearest emergency room.   Pt provided with a written script, no further questions.

## 2022-12-31 LAB — URINE CULTURE,ROUTINE: URINE CULTURE: NO GROWTH
